# Patient Record
Sex: Female | Born: 1985 | Hispanic: Yes | Marital: Single | Smoking: Never smoker
Health system: Southern US, Community
[De-identification: ages and names within clinical notes are randomized; demographics above are authoritative.]

---

## 2008-12-11 ENCOUNTER — Emergency Department: Payer: Self-pay | Admitting: Emergency Medicine

## 2008-12-19 ENCOUNTER — Emergency Department: Payer: Self-pay | Admitting: Emergency Medicine

## 2009-02-18 ENCOUNTER — Ambulatory Visit: Payer: Self-pay | Admitting: Advanced Practice Midwife

## 2010-01-08 ENCOUNTER — Inpatient Hospital Stay: Payer: Self-pay | Admitting: Obstetrics and Gynecology

## 2010-01-18 ENCOUNTER — Emergency Department: Payer: Self-pay

## 2010-06-13 IMAGING — US US OB US >=[ID] SNGL FETUS
1 series · 14 of 14 positions shown · non-contrast
Comparison: none

preg  no heartones
COMMENTS:

PROCEDURE:     US  - US OB GREATER/OR EQUAL TO ABQMB  - February 18, 2009  [DATE]
RESULT:     Emergent pelvic ultrasound is performed to evaluate fetal
viability. The study demonstrates a single intrauterine fetus with no
cardiac activity or fetal motion evident. By LMP the gestational age should
BE approximately 23 weeks 4 days. Fetal measurements sonographically placed
place the gestational age at 19 weeks one day. The placenta is posteriorly
located. There is a cephalic presentation. The findings were discussed with
Dr. Nik Monserrat and the patient at approximately [DATE] p.m. The patient was
instructed to call Dr. [REDACTED] at the health department in
the morning.

[Series 1: us ob us >=(id) sngl fetus · 14 of 14 slices shown]
[im 1/14]
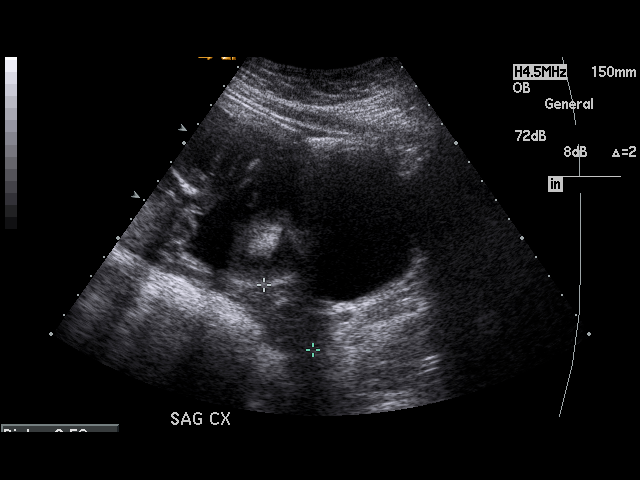
[im 2/14]
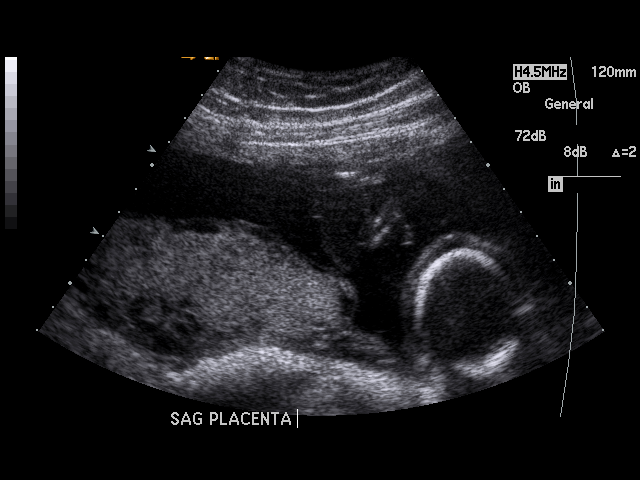
[im 3/14]
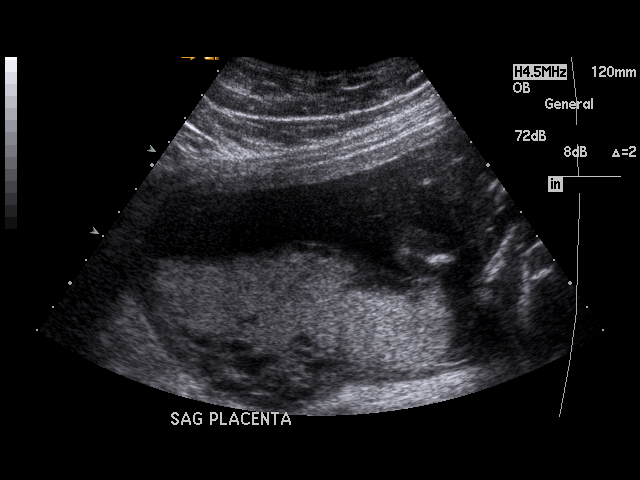
[im 4/14]
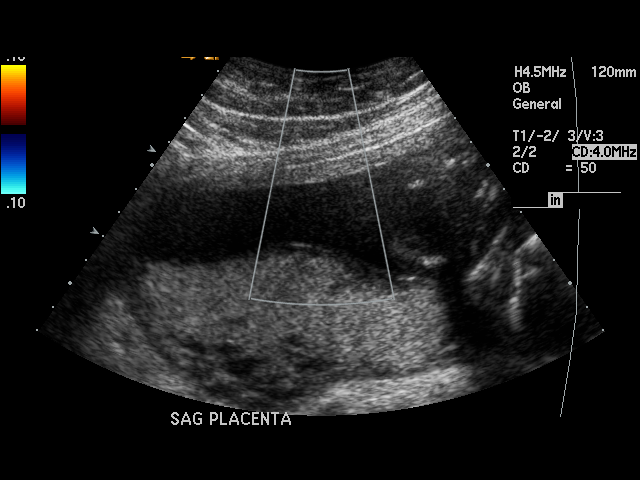
[im 5/14]
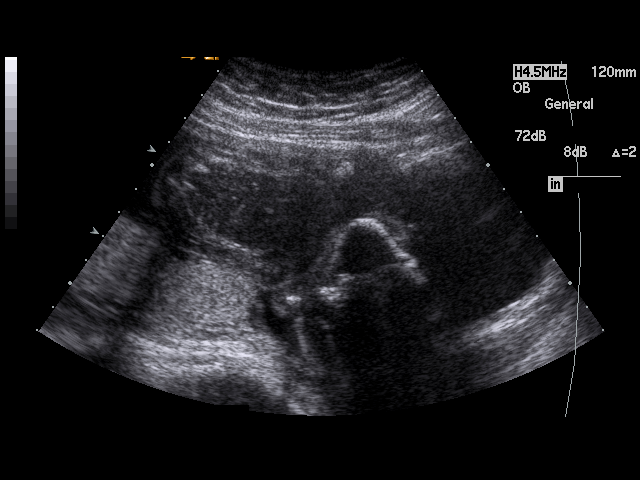
[im 6/14]
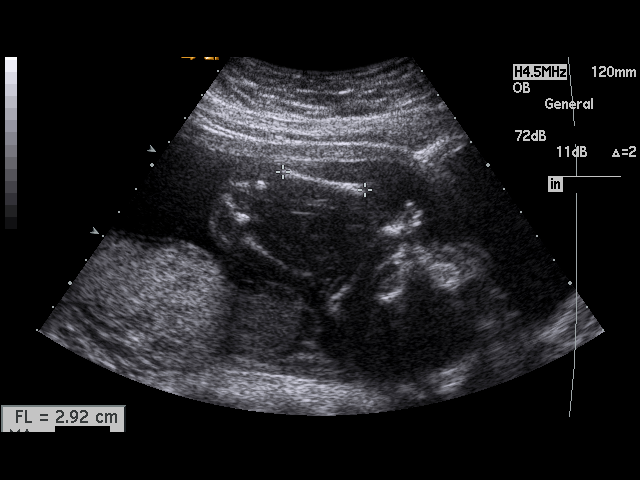
[im 7/14]
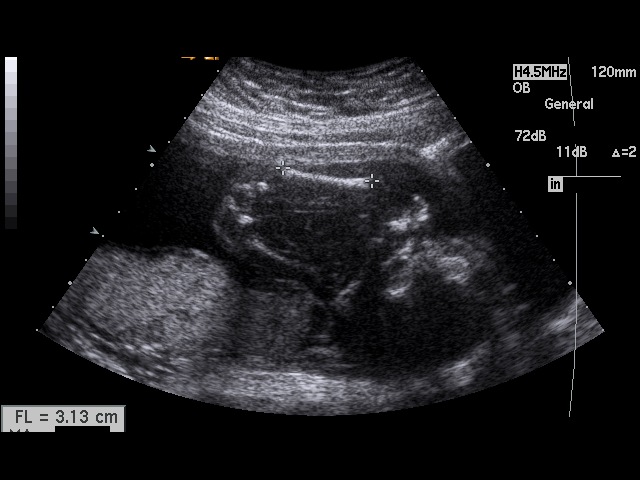
[im 8/14]
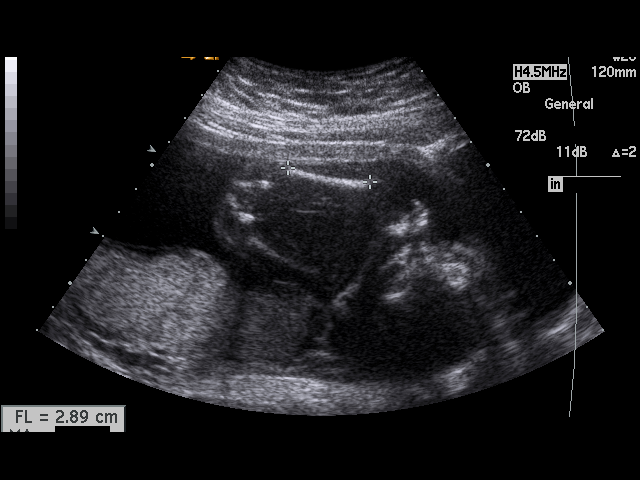
[im 9/14]
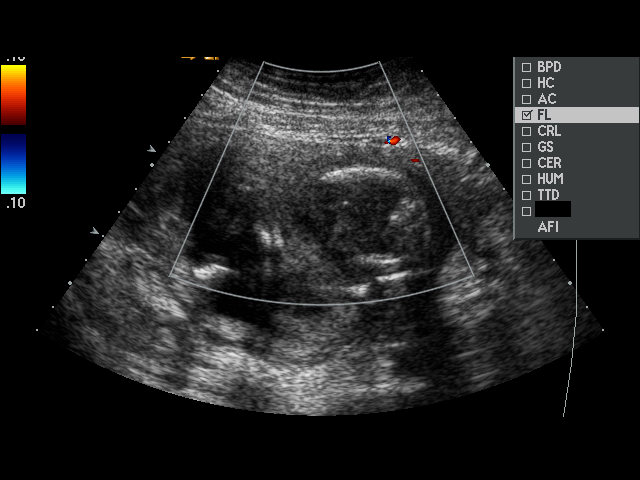
[im 10/14]
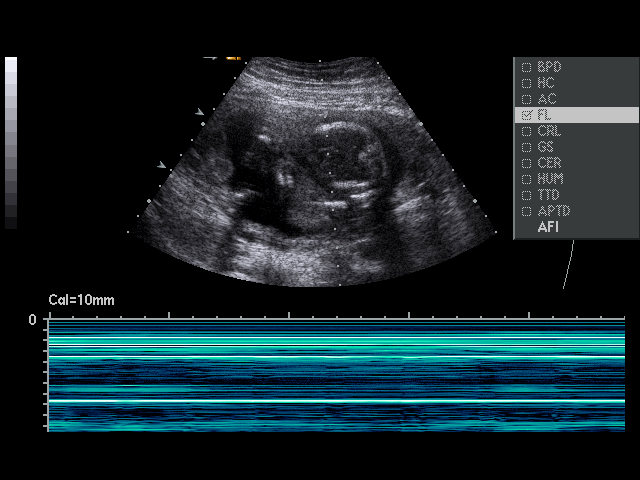
[im 11/14]
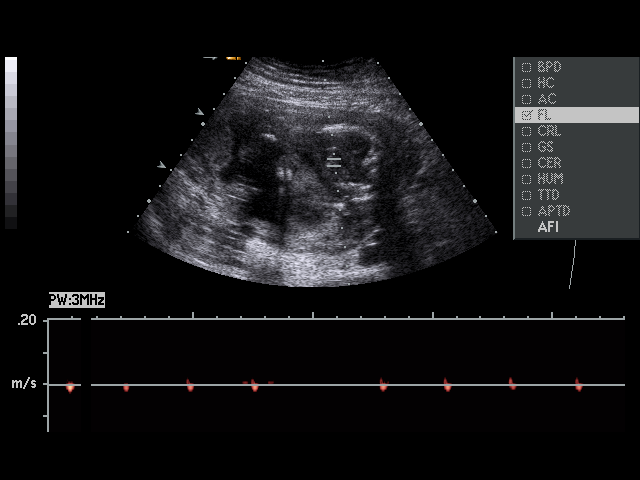
[im 12/14]
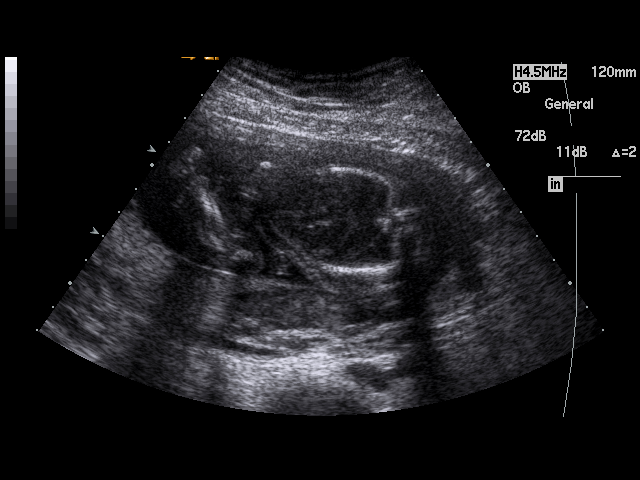
[im 13/14]
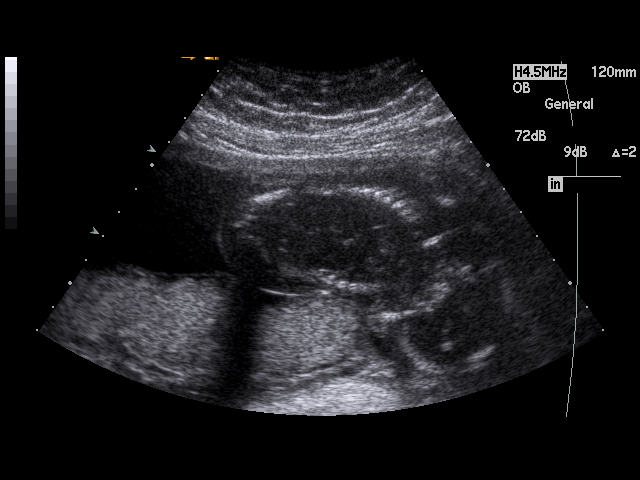
[im 14/14]
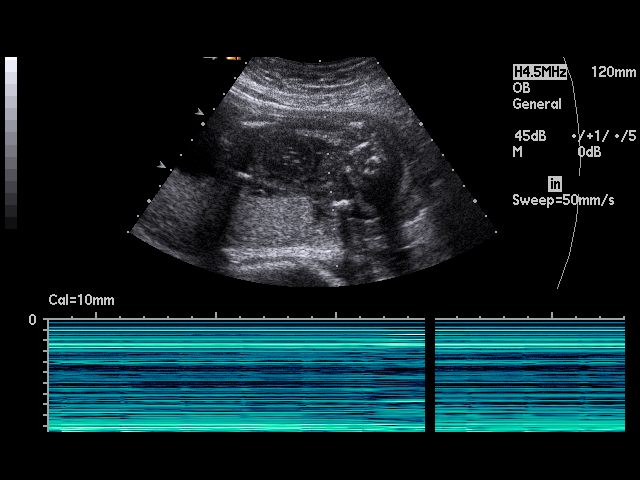

[14 of 14 positions shown; findings below may reference images not displayed]

IMPRESSION: Findings consistent with intrauterine fetal demise at
approximately 19 weeks one day + / - 13 days.

## 2011-04-14 ENCOUNTER — Ambulatory Visit: Payer: Self-pay

## 2015-10-13 ENCOUNTER — Encounter: Payer: Self-pay | Admitting: Emergency Medicine

## 2015-10-13 ENCOUNTER — Emergency Department
Admission: EM | Admit: 2015-10-13 | Discharge: 2015-10-13 | Disposition: A | Discharge status: Home / Self Care | Payer: Self-pay | Attending: Emergency Medicine | Admitting: Emergency Medicine

## 2015-10-13 ENCOUNTER — Emergency Department: Payer: Self-pay

## 2015-10-13 DIAGNOSIS — J189 Pneumonia, unspecified organism: Secondary | ICD-10-CM | POA: Insufficient documentation

## 2015-10-13 DIAGNOSIS — J181 Lobar pneumonia, unspecified organism: Secondary | ICD-10-CM

## 2015-10-13 DIAGNOSIS — R109 Unspecified abdominal pain: Secondary | ICD-10-CM | POA: Insufficient documentation

## 2015-10-13 MED ORDER — SULFAMETHOXAZOLE-TRIMETHOPRIM 800-160 MG PO TABS: 1.0000 | ORAL_TABLET | Freq: Two times a day (BID) | ORAL | Status: DC

## 2015-10-13 MED ORDER — HYDROCOD POLST-CPM POLST ER 10-8 MG/5ML PO SUER: 5.0000 mL | Freq: Two times a day (BID) | ORAL | Status: DC

## 2015-10-13 MED ORDER — DEXAMETHASONE SODIUM PHOSPHATE 10 MG/ML IJ SOLN
10.0000 mg | Freq: Once | INTRAMUSCULAR | Status: AC
  Administered 2015-10-13: 10 mg via INTRAMUSCULAR
  Filled 2015-10-13: qty 1

## 2015-10-13 MED ORDER — METHYLPREDNISOLONE 4 MG PO TBPK: ORAL_TABLET | ORAL | Status: DC

## 2015-10-13 MED ORDER — HYDROCOD POLST-CPM POLST ER 10-8 MG/5ML PO SUER
5.0000 mL | Freq: Once | ORAL | Status: AC
  Administered 2015-10-13: 5 mL via ORAL
  Filled 2015-10-13: qty 5

## 2015-10-13 NOTE — ED Provider Notes (Signed)
[facility] Emergency Department Provider Note  ____________________________________________  Time seen: Approximately 9:35 AM  I have reviewed the triage vital signs and the nursing notes.   HISTORY  Chief Complaint Cough    HPI Darlene Savage is a 30 y.o. female presents for cough x 3 weeks. The cough has been persistent for the past 3 weeks but she started having shortness of breath this morning. She has pain with coughing which is localized in the pharynx and lungs. She feels that she has phlegm but cannot expel it. She has relief with Robitussin at night and had no improvement with Sudafed. Aggravating factors include taking deep breaths, eating and drinking. Associated symptoms include dysphagia, lower abdominal pain with coughing, and right temporal throbbing. She also notes being more cold than normal recently. Her coughing is worse at night. She ranks the severity an 8/10. No history of asthma, COPD, or lung cancer. Denies tobacco use. Immunizations are UTD except her last flu shot was 2 years ago. The patient works at a [employer] and denies sick contacts. Denies fever, chills, dyspnea on exertion, nausea and vomiting.   History reviewed. No pertinent past medical history.  There are no active problems to display for this patient.   History reviewed. No pertinent past surgical history.  Current Outpatient Rx  Name  Route  Sig  Dispense  Refill  . chlorpheniramine-HYDROcodone (TUSSIONEX PENNKINETIC ER) 10-8 MG/5ML SUER   Oral   Take 5 mLs by mouth 2 (two) times daily.   115 mL   0   . methylPREDNISolone (MEDROL DOSEPAK) 4 MG TBPK tablet      Take Tapered dose as directed   21 tablet   0   . sulfamethoxazole-trimethoprim (BACTRIM DS,SEPTRA DS) 800-160 MG tablet   Oral   Take 1 tablet by mouth 2 (two) times daily.   20 tablet   0     Allergies Review of patient's allergies indicates not on file.  No family history on  file.  Social History Social History  Substance Use Topics  . Smoking status: Never Smoker   . Smokeless tobacco: None  . Alcohol Use: No    Review of Systems Constitutional: No fever/chills ENT: Throat pain due to coughing Cardiovascular: Denies chest pain. Respiratory: Endorses shortness of breath. Gastrointestinal: Endorses abdominal pain secondary to coughing.  No nausea, no vomiting.  No diarrhea.  No constipation. Neurological: Right temporal throbbing with coughing 10-point ROS otherwise negative.  ____________________________________________   PHYSICAL EXAM:  VITAL SIGNS: ED Triage Vitals  Enc Vitals Group     BP 10/13/15 0849 114/64 mmHg     Pulse Rate 10/13/15 0849 82     Resp 10/13/15 0849 18     Temp 10/13/15 0849 98.6 F (37 C)     Temp Source 10/13/15 0849 Oral     SpO2 10/13/15 0849 100 %     Weight 10/13/15 0849 200 lb (90.719 kg)     Height 10/13/15 0849 5' (1.524 m)     Head Cir --      Peak Flow --      Pain Score 10/13/15 0849 0     Pain Loc --      Pain Edu? --      Excl. in GC? --     Constitutional: Alert and oriented. Well appearing and in no acute distress. Eyes: Conjunctivae are normal.  Head: Atraumatic. No sinus pressure on palpation Nose: No congestion/rhinnorhea. Mouth/Throat: Mucous membranes are moist.  Unable to completely visualize oropharynx  Hematological/Lymphatic/Immunilogical: No cervical lymphadenopathy. Cardiovascular: Normal rate, regular rhythm. Grossly normal heart sounds.  Respiratory: Normal respiratory effort.  No retractions. Lungs CTAB.  ____________________________________________   LABS (all labs ordered are listed, but only abnormal results are displayed)  Labs Reviewed - No data to display ____________________________________________  EKG   ____________________________________________  RADIOLOGY   ____________________________________________   PROCEDURES  Procedure(s) performed:  None  Critical Care performed: No  ____________________________________________   INITIAL IMPRESSION / ASSESSMENT AND PLAN / ED COURSE  Pertinent labs & imaging results that were available during my care of the patient were reviewed by me and considered in my medical decision making (see chart for details).  Left lower lobe pneumonia. Discussed x-ray findings with patient. Patient given prescriptions for Bactrim DS, Medrol Dosepak, and Tussionex. Patient on a work note for 2 days. [facility]. ____________________________________________   FINAL CLINICAL IMPRESSION(S) / ED DIAGNOSES  Final diagnoses:  Left lower lobe pneumonia      Joni Reining, PA-C 10/13/15 1050  Rockne Menghini, MD 10/13/15 1529

## 2015-10-13 NOTE — ED Notes (Signed)
Developed cough about 3 weeks ago   Congestion in chest  Cough is non productive    Fever or friday

## 2015-10-13 NOTE — Discharge Instructions (Signed)

## 2016-01-06 ENCOUNTER — Emergency Department
Admission: EM | Admit: 2016-01-06 | Discharge: 2016-01-06 | Disposition: A | Discharge status: Home / Self Care | Payer: Self-pay | Attending: Emergency Medicine | Admitting: Emergency Medicine

## 2016-01-06 ENCOUNTER — Encounter: Payer: Self-pay | Admitting: Emergency Medicine

## 2016-01-06 DIAGNOSIS — J029 Acute pharyngitis, unspecified: Secondary | ICD-10-CM | POA: Insufficient documentation

## 2016-01-06 LAB — POCT RAPID STREP A: STREPTOCOCCUS, GROUP A SCREEN (DIRECT): NEGATIVE

## 2016-01-06 MED ORDER — AMOXICILLIN 500 MG PO TABS: 500.0000 mg | ORAL_TABLET | Freq: Two times a day (BID) | ORAL | Status: DC

## 2016-01-06 MED ORDER — LIDOCAINE VISCOUS 2 % MT SOLN: 20.0000 mL | OROMUCOSAL | Status: AC | PRN

## 2016-01-06 NOTE — ED Notes (Signed)
Discussed discharge instructions, prescriptions, and follow-up care with patient. No questions or concerns at this time. Pt stable at discharge.  

## 2016-01-06 NOTE — Discharge Instructions (Signed)

## 2016-01-06 NOTE — ED Notes (Signed)
Pt presents with sore throat and headache since yesterday. Reports fever yesterday, not today. Pt states she is fatigued. NAD noted.

## 2016-01-06 NOTE — ED Provider Notes (Signed)
[facility] Emergency Department Provider Note  ____________________________________________  Time seen: Approximately 6:05 PM  I have reviewed the triage vital signs and the nursing notes.   HISTORY  Chief Complaint Sore Throat    HPI Darlene Savage is a 30 y.o. female presents for evaluation of sore throat and headache since yesterday. Patient reports no fever today. States that she is fatigued, run down and tired. No energy. Appetite unchanged.   History reviewed. No pertinent past medical history.  There are no active problems to display for this patient.   History reviewed. No pertinent past surgical history.  Current Outpatient Rx  Name  Route  Sig  Dispense  Refill  . amoxicillin (AMOXIL) 500 MG tablet   Oral   Take 1 tablet (500 mg total) by mouth 2 (two) times daily.   20 tablet   0   . lidocaine (XYLOCAINE) 2 % solution   Mouth/Throat   Use as directed 20 mLs in the mouth or throat as needed for mouth pain.   100 mL   0     Allergies Review of patient's allergies indicates no known allergies.  History reviewed. No pertinent family history.  Social History Social History  Substance Use Topics  . Smoking status: Never Smoker   . Smokeless tobacco: None  . Alcohol Use: No    Review of Systems Constitutional: No fever/chills ENT: Positive sore throat Cardiovascular: Denies chest pain. Respiratory: Denies shortness of breath. Genitourinary: Negative for dysuria. Musculoskeletal: Negative for back pain. Skin: Negative for rash. Positive cervical adenopathy tender bilaterally Neurological: Negative for headaches, focal weakness or numbness.  10-point ROS otherwise negative.  ____________________________________________   PHYSICAL EXAM:  VITAL SIGNS: ED Triage Vitals  Enc Vitals Group     BP 01/06/16 1754 112/66 mmHg     Pulse Rate 01/06/16 1754 97     Resp 01/06/16 1754 20     Temp 01/06/16 1754 98.1 F  (36.7 C)     Temp Source 01/06/16 1754 Oral     SpO2 01/06/16 1754 100 %     Weight 01/06/16 1754 208 lb (94.348 kg)     Height 01/06/16 1754 5' (1.524 m)     Head Cir --      Peak Flow --      Pain Score 01/06/16 1755 8     Pain Loc --      Pain Edu? --      Excl. in GC? --     Constitutional: Alert and oriented. Well appearing and in no acute distress. Head: Atraumatic. Nose: No congestion/rhinnorhea. Mouth/Throat: Mucous membranes are moist.  OropharynxIs extremely erythematous, no exudate. Neck: No stridor. Cervical adenopathy tenderness bilaterally.  Cardiovascular: Normal rate, regular rhythm. Grossly normal heart sounds.  Good peripheral circulation. Respiratory: Normal respiratory effort.  No retractions. Lungs CTAB. Neurologic:  Normal speech and language. No gross focal neurologic deficits are appreciated. No gait instability. Skin:  Skin is warm, dry and intact. No rash noted. Psychiatric: Mood and affect are normal. Speech and behavior are normal.  ____________________________________________   LABS (all labs ordered are listed, but only abnormal results are displayed)  Labs Reviewed  CULTURE, GROUP A STREP [facility])  POCT RAPID STREP A   ____________________________________________    ____________________________________________   PROCEDURES  Procedure(s) performed: None  Critical Care performed: No  ____________________________________________   INITIAL IMPRESSION / ASSESSMENT AND PLAN / ED COURSE  Pertinent labs & imaging results that were available during my care of  the patient were reviewed by me and considered in my medical decision making (see chart for details).  Acute pharyngitis. Rapid strep negative. Rx given for Amoxil 500 mg twice a day 10 days. Rx given for viscous lidocaine. Follow up with PCP or return to the ER physician. ____________________________________________   FINAL CLINICAL IMPRESSION(S) / ED DIAGNOSES  Final diagnoses:   Acute pharyngitis, unspecified pharyngitis type     This chart was dictated using voice recognition software/Dragon. Despite best efforts to proofread, errors can occur which can change the meaning. Any change was purely unintentional.   Evangeline Dakinharles M Beers, PA-C 01/06/16 1847  Phineas SemenGraydon Goodman, MD 01/06/16 2014

## 2016-01-09 LAB — CULTURE, GROUP A STREP (THRC)

## 2016-12-30 ENCOUNTER — Emergency Department: Payer: Self-pay

## 2016-12-30 ENCOUNTER — Encounter: Payer: Self-pay | Admitting: Emergency Medicine

## 2016-12-30 DIAGNOSIS — Z3A15 15 weeks gestation of pregnancy: Secondary | ICD-10-CM | POA: Insufficient documentation

## 2016-12-30 DIAGNOSIS — W2102XA Struck by soccer ball, initial encounter: Secondary | ICD-10-CM | POA: Insufficient documentation

## 2016-12-30 DIAGNOSIS — Y999 Unspecified external cause status: Secondary | ICD-10-CM | POA: Insufficient documentation

## 2016-12-30 DIAGNOSIS — O9A212 Injury, poisoning and certain other consequences of external causes complicating pregnancy, second trimester: Secondary | ICD-10-CM | POA: Insufficient documentation

## 2016-12-30 DIAGNOSIS — S3991XA Unspecified injury of abdomen, initial encounter: Secondary | ICD-10-CM | POA: Insufficient documentation

## 2016-12-30 DIAGNOSIS — Y939 Activity, unspecified: Secondary | ICD-10-CM | POA: Insufficient documentation

## 2016-12-30 DIAGNOSIS — Y929 Unspecified place or not applicable: Secondary | ICD-10-CM | POA: Insufficient documentation

## 2016-12-30 LAB — POC URINE PREG, ED: Preg Test, Ur: POSITIVE — AB

## 2016-12-30 NOTE — ED Triage Notes (Signed)
Pt ambulatory to triage in NAD, report [redacted] weeks pregnant and high risk due to clotting disorder, currently taking lovenox and baby ASA.  Report today hit in abdomen with soccer ball, denies pain, has not been to bathroom so unsure of bleeding.

## 2016-12-31 ENCOUNTER — Emergency Department
Admission: EM | Admit: 2016-12-31 | Discharge: 2016-12-31 | Disposition: A | Discharge status: Home / Self Care | Payer: Self-pay | Attending: Emergency Medicine | Admitting: Emergency Medicine

## 2016-12-31 DIAGNOSIS — S3991XA Unspecified injury of abdomen, initial encounter: Secondary | ICD-10-CM

## 2016-12-31 DIAGNOSIS — Z3A15 15 weeks gestation of pregnancy: Secondary | ICD-10-CM

## 2016-12-31 NOTE — ED Provider Notes (Signed)
[facility] Emergency Department Provider Note   ____________________________________________   First MD Initiated Contact with Patient 12/31/16 0157     (approximate)  I have reviewed the triage vital signs and the nursing notes.   HISTORY  Chief Complaint Abdominal Injury    HPI Darlene Savage is a 31 y.o. female [id number] approximately [redacted] weeks pregnant, high risk secondary to clotting disorder, taking Lovenox and baby aspirin daily who was accidentally hit in the abdomen with a soccer ball at her young son approximately 10pm. Denies pain, vaginal bleeding, nausea/vomiting, lightheadedness/dizziness. Wants to make sure the baby is okay.    Past medical history APAS  There are no active problems to display for this patient.   History reviewed. No pertinent surgical history.  Prior to Admission medications   Medication Sig Start Date End Date Taking? Authorizing Provider  amoxicillin (AMOXIL) 500 MG tablet Take 1 tablet (500 mg total) by mouth 2 (two) times daily. 01/06/16   Charmayne Sheer Beers, PA-C  lidocaine (XYLOCAINE) 2 % solution Use as directed 20 mLs in the mouth or throat as needed for mouth pain. 01/06/16   Evangeline Dakin, PA-C    Allergies Patient has no known allergies.  History reviewed. No pertinent family history.  Social History Social History  Substance Use Topics  . Smoking status: Never Smoker  . Smokeless tobacco: Never Used  . Alcohol use No    Review of Systems  Constitutional: No fever/chills. Eyes: No visual changes. ENT: No sore throat. Cardiovascular: Denies chest pain. Respiratory: Denies shortness of breath. Gastrointestinal: Positive for abdominal injury.  No nausea, no vomiting.  No diarrhea.  No constipation. Genitourinary: Negative for dysuria. Musculoskeletal: Negative for back pain. Skin: Negative for rash. Neurological: Negative for headaches, focal weakness or  numbness.   ____________________________________________   PHYSICAL EXAM:  VITAL SIGNS: ED Triage Vitals  Enc Vitals Group     BP 12/30/16 2209 117/68     Pulse Rate 12/30/16 2209 94     Resp 12/30/16 2209 18     Temp 12/30/16 2209 98.6 F (37 C)     Temp Source 12/30/16 2209 Oral     SpO2 12/30/16 2209 100 %     Weight 12/30/16 2210 198 lb (89.8 kg)     Height 12/30/16 2210 5' (1.524 m)     Head Circumference --      Peak Flow --      Pain Score 12/30/16 2209 0     Pain Loc --      Pain Edu? --      Excl. in GC? --     Constitutional: Alert and oriented. Well appearing and in no acute distress. Eyes: Conjunctivae are normal. PERRL. EOMI. Head: Atraumatic. Nose: No congestion/rhinnorhea. Mouth/Throat: Mucous membranes are moist.  Oropharynx non-erythematous. Neck: No stridor.   Cardiovascular: Normal rate, regular rhythm. Grossly normal heart sounds.  Good peripheral circulation. Respiratory: Normal respiratory effort.  No retractions. Lungs CTAB. Gastrointestinal: Soft and nontender. Old ecchymosis from Lovenox injections. No distention. No abdominal bruits. No CVA tenderness. Musculoskeletal: No lower extremity tenderness nor edema.  No joint effusions. Neurologic:  Normal speech and language. No gross focal neurologic deficits are appreciated. No gait instability. Skin:  Skin is warm, dry and intact. No rash noted. Psychiatric: Mood and affect are normal. Speech and behavior are normal.  ____________________________________________   LABS (all labs ordered are listed, but only abnormal results are displayed)  Labs Reviewed  POC URINE PREG,  ED - Abnormal; Notable for the following:       Result Value   Preg Test, Ur Positive (*)    All other components within normal limits   ____________________________________________  EKG  None ____________________________________________  RADIOLOGY  OB ultrasound interpreted per Dr. Clovis RileyMitchell: Single living  intrauterine gestation. Low lying posterior placenta,  1.2 cm from the internal os. Normal fluid volume.   ____________________________________________   PROCEDURES  Procedure(s) performed:   Pelvic exam: Patient declined speculum exam. External exam WNL. No vaginal bleeding.   Procedures  Critical Care performed: No  ____________________________________________   INITIAL IMPRESSION / ASSESSMENT AND PLAN / ED COURSE  Pertinent labs & imaging results that were available during my care of the patient were reviewed by me and considered in my medical decision making (see chart for details).  31 year old female in second trimester pregnancy who was extremity struck in the abdomen by a soccer ball by her young son. Ultrasound reassuring. Patient declines lab work at my recommendation to evaluate for blood loss; she states she has had a long wait and desires to be discharged. Agrees to orthostatic vital signs.  Clinical Course as of Dec 31 245  Fri Dec 31, 2016  0246 Orthostatics within normal limits. Patient reassured by her ultrasound and eager for discharge. Strict return precautions given. Patient verbalizes understanding and agrees with plan of care.  [JS]    Clinical Course User Index [JS] Irean HongJade J Geffrey Michaelsen, MD     ____________________________________________   FINAL CLINICAL IMPRESSION(S) / ED DIAGNOSES  Final diagnoses:  Abdominal injury, initial encounter  [redacted] weeks gestation of pregnancy      NEW MEDICATIONS STARTED DURING THIS VISIT:  New Prescriptions   No medications on file     Note:  This document was prepared using Dragon voice recognition software and may include unintentional dictation errors.    Irean HongJade J Luiscarlos Kaczmarczyk, MD 12/31/16 [phone]

## 2016-12-31 NOTE — ED Notes (Signed)
Dr. Dolores FrameSung at the bedside for pt evaluation. Pt tolerated well.

## 2016-12-31 NOTE — Discharge Instructions (Signed)
Continue daily medications. Return to the ER for worsening symptoms, persistent vomiting, severe abdominal pain, vaginal bleeding, fainting or other concerns.

## 2018-04-24 IMAGING — US US OB LIMITED
1 series · 14 of 27 positions shown · non-contrast
Comparison: none

CLINICAL DATA: Blunt trauma to the abdomen.

EXAM:
LIMITED OBSTETRIC ULTRASOUND

[Series 1: us ob limited · 0.22mm/px · 27 acquisitions, 14 frames shown]
[im 1/27]
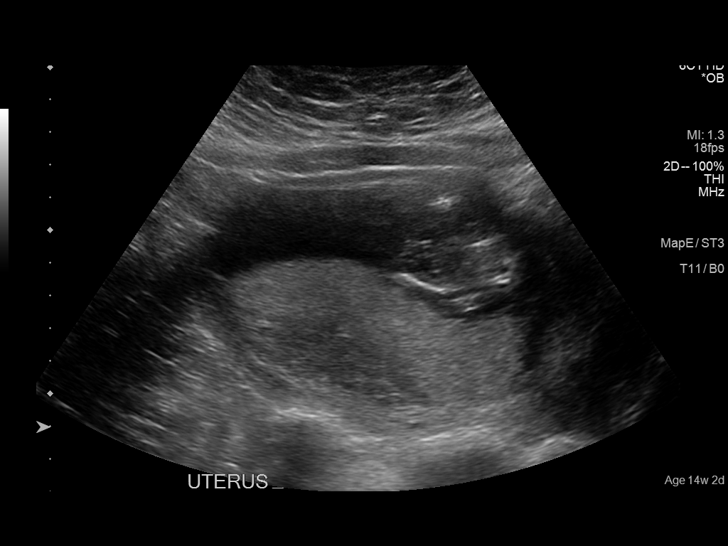
[im 3/27]
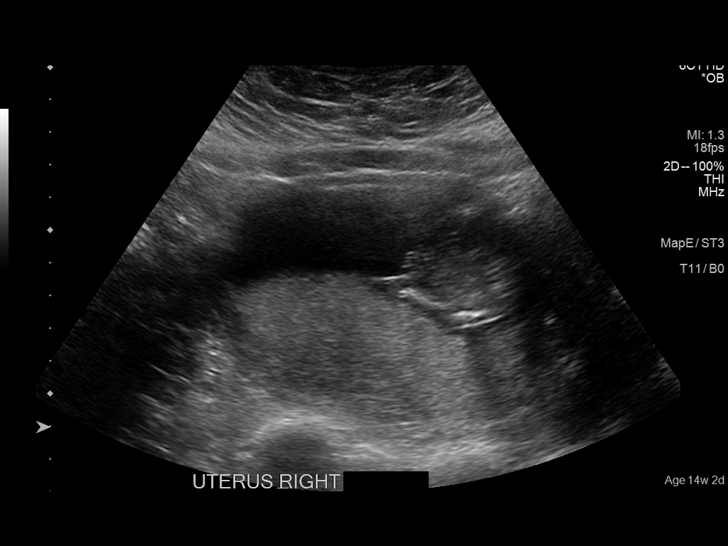
[im 5/27]
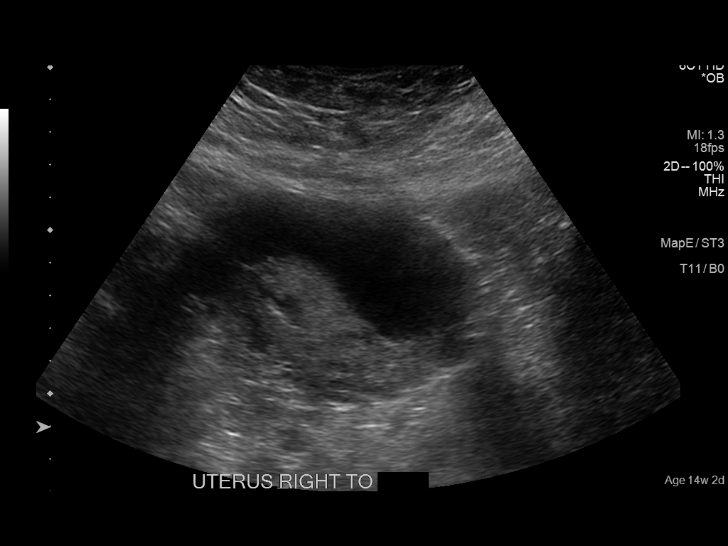
[im 7/27]
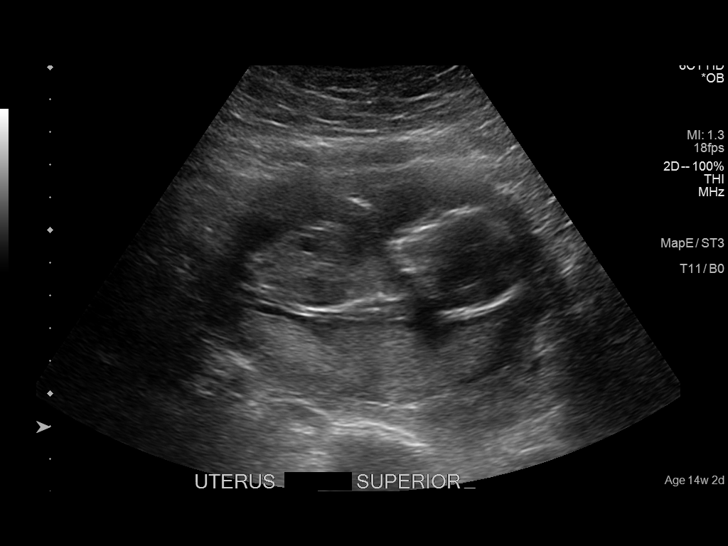
[im 9/27]
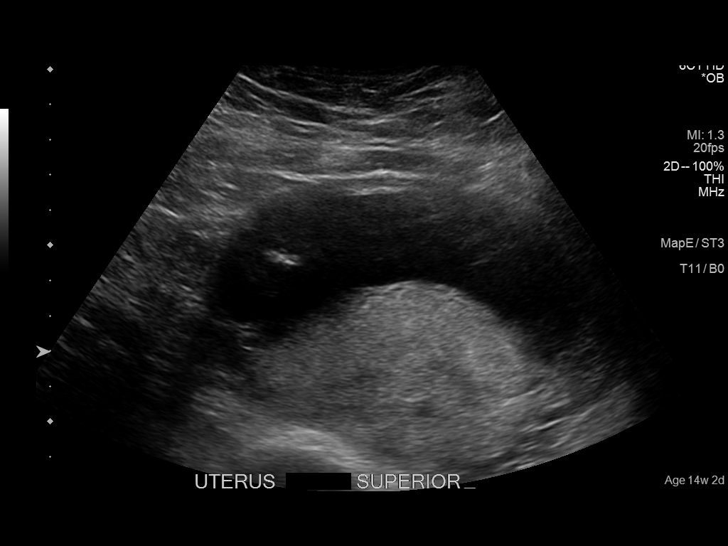
[im 11/27]
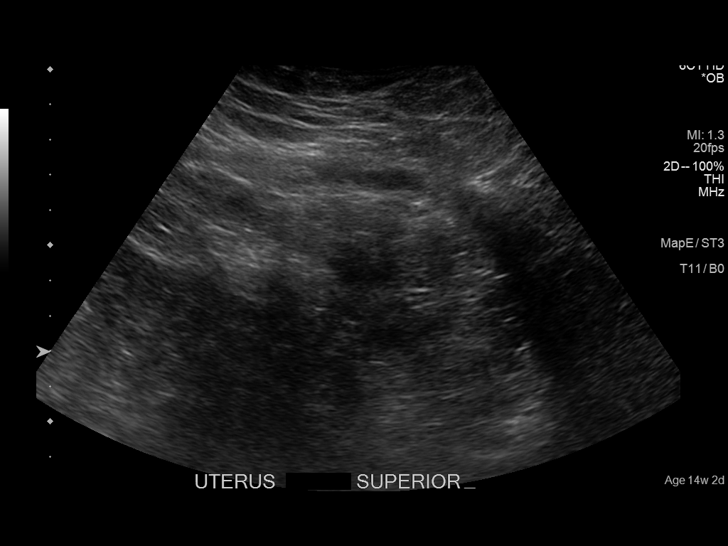
[im 13/27]
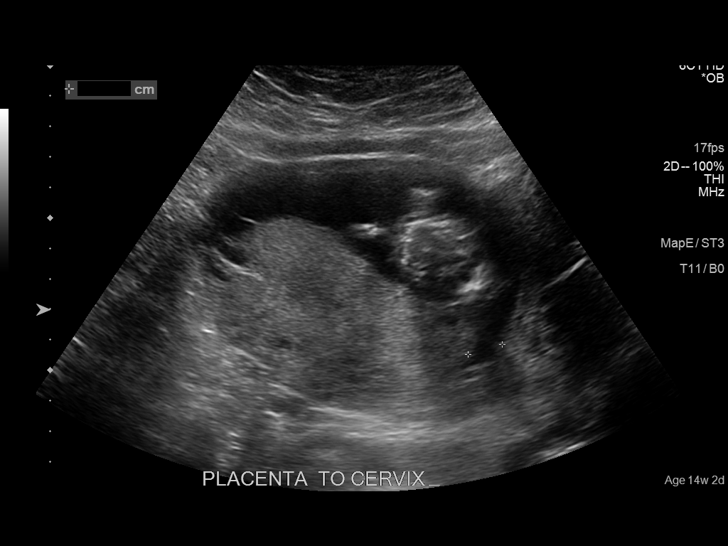
[im 15/27]
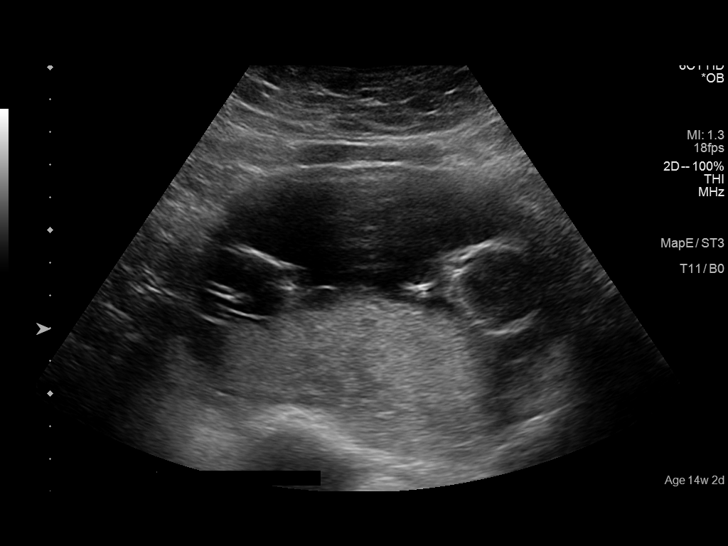
[im 17/27]
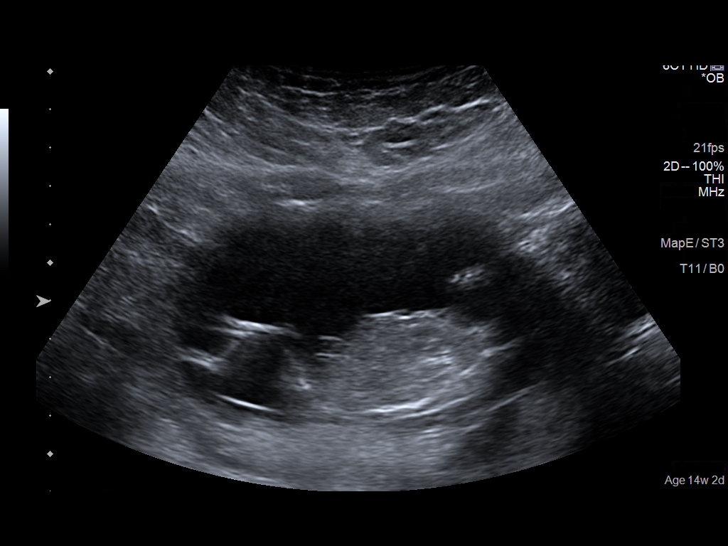
[im 19/27]
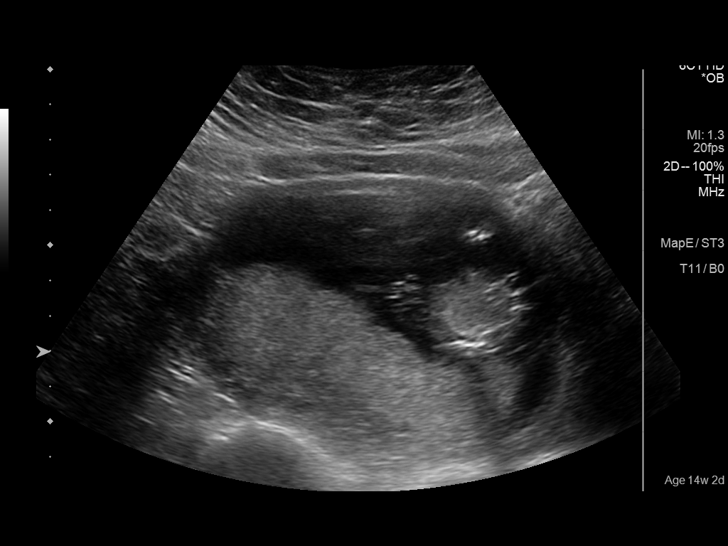
[im 21/27]
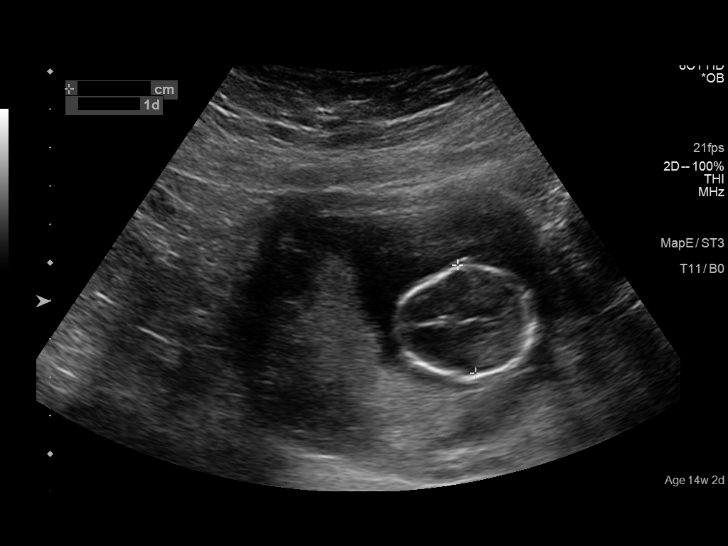
[im 23/27]
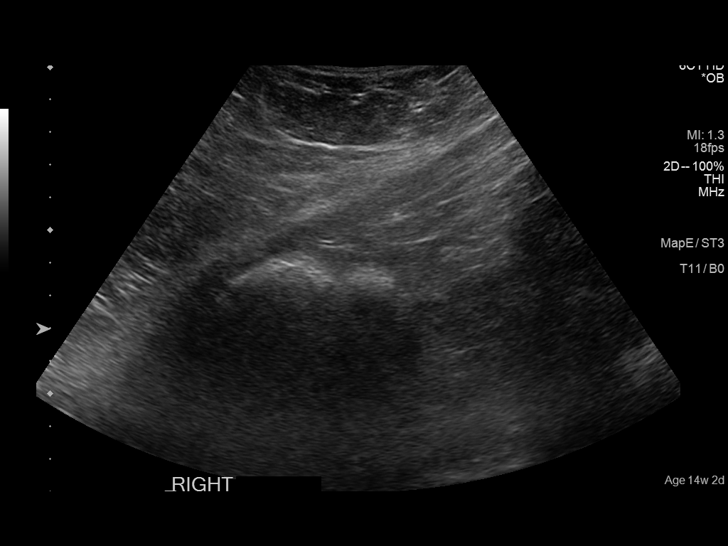
[im 25/27]
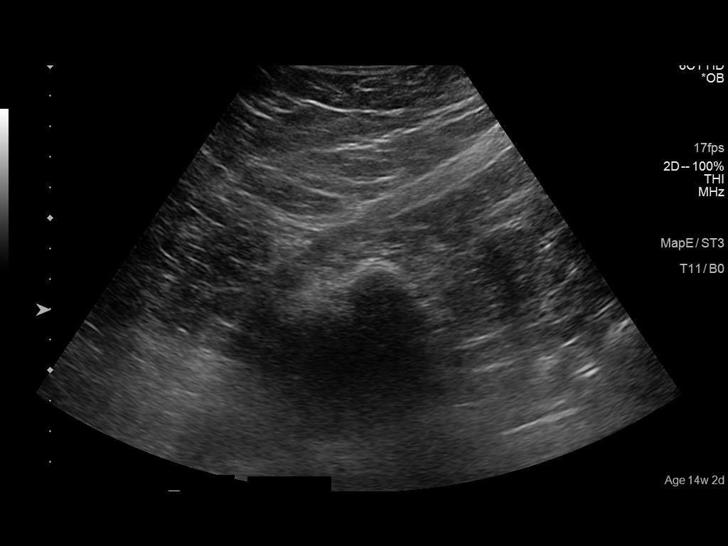
[im 27/27]
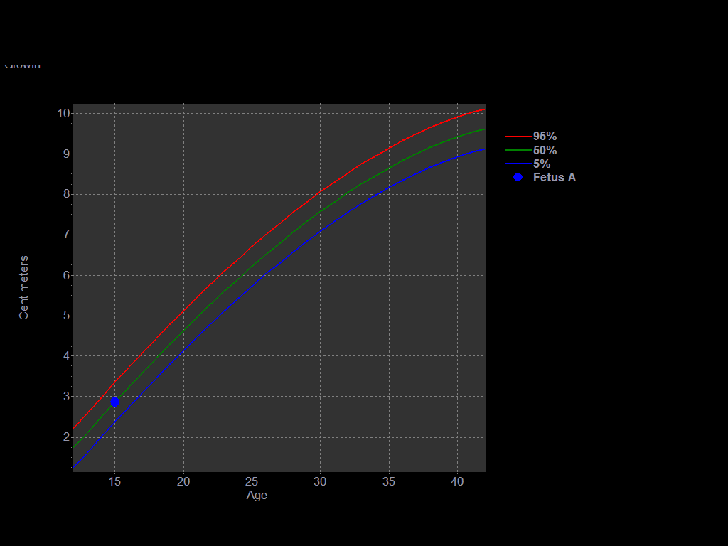

[14 of 27 positions shown; findings below may reference images not displayed]

FINDINGS: Number of Fetuses: 1

Heart Rate:  165 bpm

Movement: Visible

Presentation: Cephalic

Placental Location: Posterior

Previa: No previa, but the placenta is low lying, 1.2 cm from the
internal os.

Amniotic Fluid (Subjective):  Within normal limits.

BPD:  2.86cm 15w  1d

MATERNAL FINDINGS:

Cervix:  Appears closed.

Uterus/Adnexae:  No abnormality visualized.
IMPRESSION: Single living intrauterine gestation. Low lying posterior placenta,
1.2 cm from the internal os. Normal fluid volume.

This exam is performed on an emergent basis and does not
comprehensively evaluate fetal size, dating, or anatomy; follow-up
complete OB US should be considered if further fetal assessment is
warranted.

## 2022-10-06 ENCOUNTER — Emergency Department
Admission: EM | Admit: 2022-10-06 | Discharge: 2022-10-06 | Disposition: A | Discharge status: Home / Self Care | Payer: Self-pay | Attending: Emergency Medicine | Admitting: Emergency Medicine

## 2022-10-06 ENCOUNTER — Other Ambulatory Visit: Payer: Self-pay

## 2022-10-06 ENCOUNTER — Emergency Department: Payer: Self-pay

## 2022-10-06 DIAGNOSIS — J069 Acute upper respiratory infection, unspecified: Secondary | ICD-10-CM

## 2022-10-06 MED ORDER — BENZONATATE 200 MG PO CAPS: 200.0000 mg | ORAL_CAPSULE | Freq: Three times a day (TID) | ORAL | 0 refills | Status: AC | PRN

## 2022-10-06 MED ORDER — PREDNISONE 10 MG PO TABS: ORAL_TABLET | ORAL | 0 refills | Status: AC

## 2022-10-06 NOTE — ED Notes (Signed)
CORRECTION: The patient refused the swab stating she was just swabbed on the 26th

## 2022-10-06 NOTE — ED Notes (Signed)
Patient refused URI panel swab. Patient states " was tested on the 26th of January".

## 2022-10-06 NOTE — Discharge Instructions (Addendum)
Follow-up with your primary care provider or urgent care as listed on your discharge papers.  If you do not have a primary care provider a list of clinics is on your discharge papers so that you can obtain a primary care doctor for the future.  A prescription for cough medication and steroids was sent to the pharmacy for you to begin taking today.  Continue drinking fluids.  Tylenol or ibuprofen if needed for body aches, headache or fever.  Please go to the following website to schedule new (and existing) patient appointments:   [email or web address]   The following is a list of primary care offices in the area who are accepting new patients at this time.  Please reach out to one of them directly and let them know you would like to schedule an appointment to follow up on an Emergency Department visit, and/or to establish a new primary care provider (PCP).  There are likely other primary care clinics in the are who are accepting new patients, but this is an excellent place to start:  [place] physician: Dr Lavon Paganini [address] [place], [place] [address] [phone]  [facility] Lead Physician: Dr Steele Sizer [address], [place], [place] [address] [phone]  [place] Physician: Dr Park Liter [address] [place], [place] [address] [phone]  [facility] Lead Physician: Dr Dewaine Oats [address] [place], [place] [address] [phone]  [facility] [place] Physician: Dr Halina Maidens [address] [place], [place], [place] [address] [phone]

## 2022-10-06 NOTE — ED Provider Notes (Signed)
[facility] Provider Note    Event Date/Time   First MD Initiated Contact with Patient 10/06/22 [id number]     (approximate)   History   Cough   HPI  Darlene Savage is a 37 y.o. female presents to the ED with complaint of cough for the last 2 weeks.  Patient states she was diagnosed with the flu on 09/23/2022 and has had a persistent cough since that time.  She denies any fever and currently denies any vomiting or diarrhea.     Physical Exam   Triage Vital Signs: ED Triage Vitals [10/06/22 0906]  Enc Vitals Group     BP      Pulse      Resp      Temp      Temp src      SpO2      Weight 197 lb 15.6 oz (89.8 kg)     Height 5' (1.524 m)     Head Circumference      Peak Flow      Pain Score 0     Pain Loc      Pain Edu?      Excl. in [place]?     Most recent vital signs: Vitals:   10/06/22 0910 10/06/22 1321  BP: 112/70 114/75  Pulse: 80 88  Resp: 18 15  Temp: 98.4 F (36.9 C)   SpO2: 96% 99%     General: Awake, no distress.  CV:  Good peripheral perfusion.  Heart regular rate and rhythm. Resp:  Normal effort.  Lungs are clear bilaterally. Abd:  No distention.  Other:     ED Results / Procedures / Treatments   Labs (all labs ordered are listed, but only abnormal results are displayed) Labs Reviewed - No data to display    RADIOLOGY Chest x-ray images reviewed by myself independent of the radiologist and no infiltrate was noted.  Radiologist mentions mild central airway thickening and atelectasis.    PROCEDURES:  Critical Care performed:   Procedures   MEDICATIONS ORDERED IN ED: Medications - No data to display   IMPRESSION / MDM / [place] / ED COURSE  I reviewed the triage vital signs and the nursing notes.   Differential diagnosis includes, but is not limited to, upper respiratory infection, bronchitis, pneumonia.  37 year old female presents to the ED with complaint of cough for approximately 2  weeks.  Patient states that she was diagnosed with flu on 1/25 and has continued to have a cough since that time.  She reports that she tested negative for COVID and deferred the respiratory nasal swab in the ED today.  Chest x-ray has some mild central airway thickening and atelectasis and patient was made aware.  We discussed the use of some steroids and Tessalon pearls which may help with her symptoms.  She is to follow-up with her PCP if any continued problems.      Patient's presentation is most consistent with acute complicated illness / injury requiring diagnostic workup.  FINAL CLINICAL IMPRESSION(S) / ED DIAGNOSES   Final diagnoses:  Viral URI with cough     Rx / DC Orders   ED Discharge Orders          Ordered    predniSONE (DELTASONE) 10 MG tablet        10/06/22 1310    benzonatate (TESSALON) 200 MG capsule  3 times daily PRN        10/06/22 1310  Note:  This document was prepared using Dragon voice recognition software and may include unintentional dictation errors.   Johnn Hai, PA-C 10/06/22 1401    Lavonia Drafts, MD 10/06/22 [phone]

## 2022-10-06 NOTE — ED Triage Notes (Signed)
Cough x 2 weeks.  Worse at night.  States had flu 1/25, cough persists.  No fevers.

## 2024-09-10 ENCOUNTER — Ambulatory Visit (LOCAL_COMMUNITY_HEALTH_CENTER): Payer: Self-pay

## 2024-09-10 DIAGNOSIS — Z719 Counseling, unspecified: Secondary | ICD-10-CM

## 2024-09-10 DIAGNOSIS — Z23 Encounter for immunization: Secondary | ICD-10-CM

## 2024-09-10 NOTE — Progress Notes (Signed)
 In nurse clinic requesting immunizations as needed for immigration.  Requesting Td, Hep B, and Flu.   Patient presents vaccine records and these were placed into [facility]. Per records, patient has completed Hep B series and had Tdap 2020. Needs Flu only today. No insurance and pays for vaccine today.   Counseled on flu vaccine. Flu given and tolerated well.   Patient has 2 [facility] records. These were given to A Widderich, RN to send to state to merge. Patient plans to pick up record at [facility] when ready.   Updated [facility] copies given and reviewed. Rosabelle Jupin, RN

## 2024-09-12 ENCOUNTER — Telehealth: Payer: Self-pay

## 2024-09-12 NOTE — Telephone Encounter (Signed)
 Patient's immunization records have been merged by the state. JINNY Baston ([facility] clerk) to call patient today to notify her that her immunization record is ready for pick up at [facility] info booth. Ellinor Test, RN
